# Patient Record
Sex: Male | Born: 1985 | Race: White | Hispanic: No | Marital: Married | Smoking: Never smoker
Health system: Southern US, Community
[De-identification: ages and names within clinical notes are randomized; demographics above are authoritative.]

## PROBLEM LIST (undated history)

## (undated) DIAGNOSIS — F429 Obsessive-compulsive disorder, unspecified: Secondary | ICD-10-CM

## (undated) DIAGNOSIS — I10 Essential (primary) hypertension: Secondary | ICD-10-CM

## (undated) DIAGNOSIS — K219 Gastro-esophageal reflux disease without esophagitis: Secondary | ICD-10-CM

---

## 2016-05-24 ENCOUNTER — Encounter (HOSPITAL_COMMUNITY): Payer: Self-pay | Admitting: Emergency Medicine

## 2016-05-24 ENCOUNTER — Ambulatory Visit (HOSPITAL_COMMUNITY)
Admission: EM | Admit: 2016-05-24 | Discharge: 2016-05-24 | Disposition: A | Discharge status: Home / Self Care | Payer: Managed Care, Other (non HMO) | Attending: Internal Medicine | Admitting: Internal Medicine

## 2016-05-24 DIAGNOSIS — K529 Noninfective gastroenteritis and colitis, unspecified: Secondary | ICD-10-CM | POA: Diagnosis not present

## 2016-05-24 DIAGNOSIS — K219 Gastro-esophageal reflux disease without esophagitis: Secondary | ICD-10-CM

## 2016-05-24 HISTORY — DX: Gastro-esophageal reflux disease without esophagitis: K21.9

## 2016-05-24 HISTORY — DX: Essential (primary) hypertension: I10

## 2016-05-24 HISTORY — DX: Obsessive-compulsive disorder, unspecified: F42.9

## 2016-05-24 MED ORDER — ONDANSETRON HCL 4 MG PO TABS: 8.0000 mg | ORAL_TABLET | ORAL | 0 refills | Status: AC | PRN

## 2016-05-24 MED ORDER — ONDANSETRON 4 MG PO TBDP
ORAL_TABLET | ORAL | Status: AC
  Filled 2016-05-24: qty 2

## 2016-05-24 MED ORDER — METOCLOPRAMIDE HCL 10 MG PO TABS: 10.0000 mg | ORAL_TABLET | Freq: Four times a day (QID) | ORAL | 0 refills | Status: DC | PRN

## 2016-05-24 MED ORDER — ONDANSETRON 4 MG PO TBDP
8.0000 mg | ORAL_TABLET | Freq: Once | ORAL | Status: AC
  Administered 2016-05-24: 8 mg via ORAL

## 2016-05-24 NOTE — ED Provider Notes (Signed)
[facility]    CSN: [id number] Arrival date & time: 05/24/16  1946     History   Chief Complaint Chief Complaint  Patient presents with  . Nausea    HPI David Hanson is a 31 y.o. male. He presents today with recent history of diarrhea, and vomiting started early yesterday am. Vomiting had subsided a little after midnight today, and he has been able to keep sips of liquids down. Ate a bagel about 4 PM, and has been extremely nauseous since. Worried that the vomiting will start again.  No abdominal pain, no fever.      HPI  Past Medical History:  Diagnosis Date  . GERD (gastroesophageal reflux disease)   . Hypertension   . OCD (obsessive compulsive disorder)     History reviewed. No pertinent surgical history.     Home Medications    Prior to Admission medications   Medication Sig Start Date End Date Taking? Authorizing Provider  lisinopril-hydrochlorothiazide (PRINZIDE,ZESTORETIC) 20-25 MG tablet Take 1 tablet by mouth daily.   Yes [provider]  omeprazole (PRILOSEC) 20 MG capsule Take 20 mg by mouth daily.   Yes [provider]  PARoxetine HCl (PAXIL PO) Take by mouth.   Yes [provider]  promethazine (PHENERGAN) 25 MG tablet Take 25 mg by mouth every 6 (six) hours as needed for nausea or vomiting.   Yes [provider]  ranitidine (ZANTAC) 150 MG capsule Take 150 mg by mouth 2 (two) times daily.   Yes [provider]  metoCLOPramide (REGLAN) 10 MG tablet Take 1 tablet (10 mg total) by mouth 4 (four) times daily as needed for nausea. 05/24/16   Eustace MooreMurray, Dorena Dorfman W, MD  ondansetron (ZOFRAN) 4 MG tablet Take 2 tablets (8 mg total) by mouth every 4 (four) hours as needed for nausea or vomiting. 05/24/16   Eustace MooreMurray, Alizzon Dioguardi W, MD    Family History No family history on file.  Social History Social History  Substance Use Topics  . Smoking status: Never Smoker  . Smokeless tobacco: Not on file  . Alcohol use Yes      Allergies   Augmentin [amoxicillin-pot clavulanate]; Ciprofloxacin; and Suprax [cefixime]   Review of Systems Review of Systems  All other systems reviewed and are negative.    Physical Exam Triage Vital Signs ED Triage Vitals [05/24/16 2025]  Enc Vitals Group     BP 127/79     Pulse Rate (!) 105     Resp (!) 24     Temp 98.5 F (36.9 C)     Temp Source Oral     SpO2 99 %     Weight      Height      Pain Score      Pain Loc    Updated Vital Signs BP 127/79 (BP Location: Right Arm)   Pulse (!) 105   Temp 98.5 F (36.9 C) (Oral)   Resp (!) 24   SpO2 99%   Physical Exam  Constitutional: He is oriented to person, place, and time. No distress.  Alert, nicely groomed  HENT:  Head: Atraumatic.  Eyes:  Conjugate gaze, no eye redness/drainage  Neck: Neck supple.  Cardiovascular: Regular rhythm.   HR 100s  Pulmonary/Chest: No respiratory distress. He has no wheezes. He has no rales.  Lungs clear, symmetric breath sounds.  Abdominal: He exhibits no distension.  Musculoskeletal: Normal range of motion.  Neurological: He is alert and oriented to person, place, and time.  Skin: Skin is warm and dry.  No cyanosis  Nursing note and vitals reviewed.    UC Treatments / Results   Procedures Procedures (including critical care time)  Medications Ordered in UC Medications  ondansetron (ZOFRAN-ODT) disintegrating tablet 8 mg (8 mg Oral Given 05/24/16 2120)     Final Clinical Impressions(s) / UC Diagnoses   Final diagnoses:  Gastroenteritis, acute  Gastric reflux   Anticipate gradual improvement in nausea and diarrhea over the next several days.  Diarrhea may take a couple weeks to resolve.  Prescription for  Recheck for new fever >100.5, return of vomiting, severe/persistent abdominal pain, or if not starting to improve in a few days.    New Prescriptions Discharge Medication List as of 05/24/2016  9:41 PM    START taking these medications   Details   metoCLOPramide (REGLAN) 10 MG tablet Take 1 tablet (10 mg total) by mouth 4 (four) times daily as needed for nausea., Starting Sun 05/24/2016, Normal    ondansetron (ZOFRAN) 4 MG tablet Take 2 tablets (8 mg total) by mouth every 4 (four) hours as needed for nausea or vomiting., Starting Sun 05/24/2016, Normal         Eustace Moore, MD 05/27/16 2138

## 2016-05-24 NOTE — ED Triage Notes (Signed)
Nausea woke patient late Friday night.  Vomiting started around 2 pm Saturday.    Reports diarrhea prior to Friday.

## 2016-05-24 NOTE — Discharge Instructions (Addendum)
Anticipate gradual improvement in nausea and diarrhea over the next several days.  Diarrhea may take a couple weeks to resolve.  Prescription for  Recheck for new fever >100.5, return of vomiting, severe/persistent abdominal pain, or if not starting to improve in a few days.

## 2017-02-16 ENCOUNTER — Other Ambulatory Visit (HOSPITAL_COMMUNITY): Payer: Self-pay | Admitting: Family Medicine

## 2017-02-16 DIAGNOSIS — K219 Gastro-esophageal reflux disease without esophagitis: Secondary | ICD-10-CM

## 2017-02-22 ENCOUNTER — Ambulatory Visit (HOSPITAL_COMMUNITY): Payer: Managed Care, Other (non HMO)

## 2020-01-09 ENCOUNTER — Other Ambulatory Visit: Payer: Self-pay

## 2020-01-09 ENCOUNTER — Emergency Department (INDEPENDENT_AMBULATORY_CARE_PROVIDER_SITE_OTHER): Payer: Managed Care, Other (non HMO)

## 2020-01-09 ENCOUNTER — Emergency Department (INDEPENDENT_AMBULATORY_CARE_PROVIDER_SITE_OTHER)
Admission: EM | Admit: 2020-01-09 | Discharge: 2020-01-09 | Disposition: A | Discharge status: Home / Self Care | Payer: Managed Care, Other (non HMO) | Source: Home / Self Care

## 2020-01-09 DIAGNOSIS — K5900 Constipation, unspecified: Secondary | ICD-10-CM | POA: Diagnosis not present

## 2020-01-09 DIAGNOSIS — R109 Unspecified abdominal pain: Secondary | ICD-10-CM

## 2020-01-09 MED ORDER — POLYETHYLENE GLYCOL 3350 17 G PO PACK: 17.0000 g | PACK | Freq: Two times a day (BID) | ORAL | 0 refills | Status: AC

## 2020-01-09 NOTE — Discharge Instructions (Addendum)
As we discussed, there is a significant amount of undigested food needs to pass to relieve the bowel pressure.  We called in MiraLAX which can be taken twice a day as directed over the next several days to clear out the bowel and recent the tone of the bowel wall.

## 2020-01-09 NOTE — ED Triage Notes (Signed)
Pt c/o middle to lower abd pain since yesterday morning. More BMs than usual, BMs lighter in color. Pain 3/10 Feels like a pulling pressure. Starts in bellybutton and radiates down. Motrin prn.

## 2020-01-09 NOTE — ED Provider Notes (Signed)
[facility]    CSN: [id number] Arrival date & time: 01/09/20  1621      History   Chief Complaint Chief Complaint  Patient presents with  . Abdominal Pain    HPI David Hanson is a 34 y.o. male.   This is the initial [facility] urgent care patient visit for this 34 year old man complaining of lower abdominal pain.  Pt c/o middle to lower abd pain since yesterday morning. More BMs than usual, BMs lighter in color. Pain 3/10 Feels like a pulling pressure. Starts in bellybutton and radiates down. Motrin prn.   Patient has reflux esophagitis history but this has not worsened recently.  Patient stools have been more liquidy than normal.  Is been going more frequently as well.  Most of the pain disappears when he is quiet either sitting or lying, but the symptoms worsen when he is moving around.  Patient works in [employer].     Past Medical History:  Diagnosis Date  . GERD (gastroesophageal reflux disease)   . Hypertension   . OCD (obsessive compulsive disorder)     There are no problems to display for this patient.   History reviewed. No pertinent surgical history.     Home Medications    Prior to Admission medications   Medication Sig Start Date End Date Taking? Authorizing Provider  polyethylene glycol (MIRALAX MIX-IN PAX) 17 g packet Take 17 g by mouth 2 (two) times daily. 01/09/20  Yes Elvina Sidle, MD  lisinopril-hydrochlorothiazide (PRINZIDE,ZESTORETIC) 20-25 MG tablet Take 1 tablet by mouth daily.    [provider]  omeprazole (PRILOSEC) 20 MG capsule Take 20 mg by mouth daily.    [provider]  ondansetron (ZOFRAN) 4 MG tablet Take 2 tablets (8 mg total) by mouth every 4 (four) hours as needed for nausea or vomiting. 05/24/16   Isa Rankin, MD  PARoxetine HCl (PAXIL PO) Take by mouth.    [provider]  promethazine (PHENERGAN) 25 MG tablet Take 25 mg by mouth every 6 (six) hours as needed  for nausea or vomiting.    [provider]  ranitidine (ZANTAC) 150 MG capsule Take 150 mg by mouth 2 (two) times daily.    [provider]  metoCLOPramide (REGLAN) 10 MG tablet Take 1 tablet (10 mg total) by mouth 4 (four) times daily as needed for nausea. 05/24/16 01/09/20  Isa Rankin, MD    Family History History reviewed. No pertinent family history.  Social History Social History   Tobacco Use  . Smoking status: Never Smoker  Substance Use Topics  . Alcohol use: Yes  . Drug use: No     Allergies   Augmentin [amoxicillin-pot clavulanate], Ciprofloxacin, and Suprax [cefixime]   Review of Systems Review of Systems  Constitutional: Negative.   Gastrointestinal: Positive for abdominal pain. Negative for diarrhea, nausea and vomiting.     Physical Exam Triage Vital Signs ED Triage Vitals  Enc Vitals Group     BP      Pulse      Resp      Temp      Temp src      SpO2      Weight      Height      Head Circumference      Peak Flow      Pain Score      Pain Loc      Pain Edu?      Excl. in  GC?    No data found.  Updated Vital Signs BP (!) 130/93 (BP Location: Right Arm)   Pulse (!) 104   Temp 98.1 F (36.7 C) (Oral)   Resp 18   SpO2 98%    Physical Exam Vitals and nursing note reviewed.  Constitutional:      General: He is not in acute distress.    Appearance: He is well-developed. He is obese. He is not ill-appearing.  HENT:     Head: Normocephalic.     Mouth/Throat:     Mouth: Mucous membranes are moist.  Eyes:     Extraocular Movements: Extraocular movements intact.     Comments: No scleral icterus  Cardiovascular:     Rate and Rhythm: Normal rate.     Heart sounds: Normal heart sounds.  Pulmonary:     Effort: Pulmonary effort is normal.     Breath sounds: Normal breath sounds.  Abdominal:     General: Abdomen is protuberant. Bowel sounds are normal.     Palpations: Abdomen is soft. There is no hepatomegaly or  mass.     Tenderness: There is abdominal tenderness in the periumbilical area. There is no guarding or rebound.  Skin:    General: Skin is warm and dry.  Neurological:     General: No focal deficit present.     Mental Status: He is alert and oriented to person, place, and time.  Psychiatric:        Mood and Affect: Mood normal.        Behavior: Behavior normal.      UC Treatments / Results  Labs (all labs ordered are listed, but only abnormal results are displayed) Labs Reviewed - No data to display  EKG   Radiology No results found.  Procedures Procedures (including critical care time)  Medications Ordered in UC Medications - No data to display  Initial Impression / Assessment and Plan / UC Course  I have reviewed the triage vital signs and the nursing notes.  Pertinent labs & imaging results that were available during my care of the patient were reviewed by me and considered in my medical decision making (see chart for details).    Final Clinical Impressions(s) / UC Diagnoses   Final diagnoses:  Obstipation     Discharge Instructions     As we discussed, there is a significant amount of undigested food needs to pass to relieve the bowel pressure.  We called in MiraLAX which can be taken twice a day as directed over the next several days to clear out the bowel and recent the tone of the bowel wall.    ED Prescriptions    Medication Sig Dispense Auth. Provider   polyethylene glycol (MIRALAX MIX-IN PAX) 17 g packet Take 17 g by mouth 2 (two) times daily. 14 each Elvina Sidle, MD     I have reviewed the PDMP during this encounter.   Elvina Sidle, MD 01/09/20 1733

## 2021-09-25 IMAGING — DX DG ABDOMEN ACUTE W/ 1V CHEST
4 series · 4 of 4 positions shown · non-contrast
Comparison: None.

CLINICAL DATA: Mid abdominal pain over the last 2 days.

EXAM:
DG ABDOMEN ACUTE WITH 1 VIEW CHEST

[chest pa]
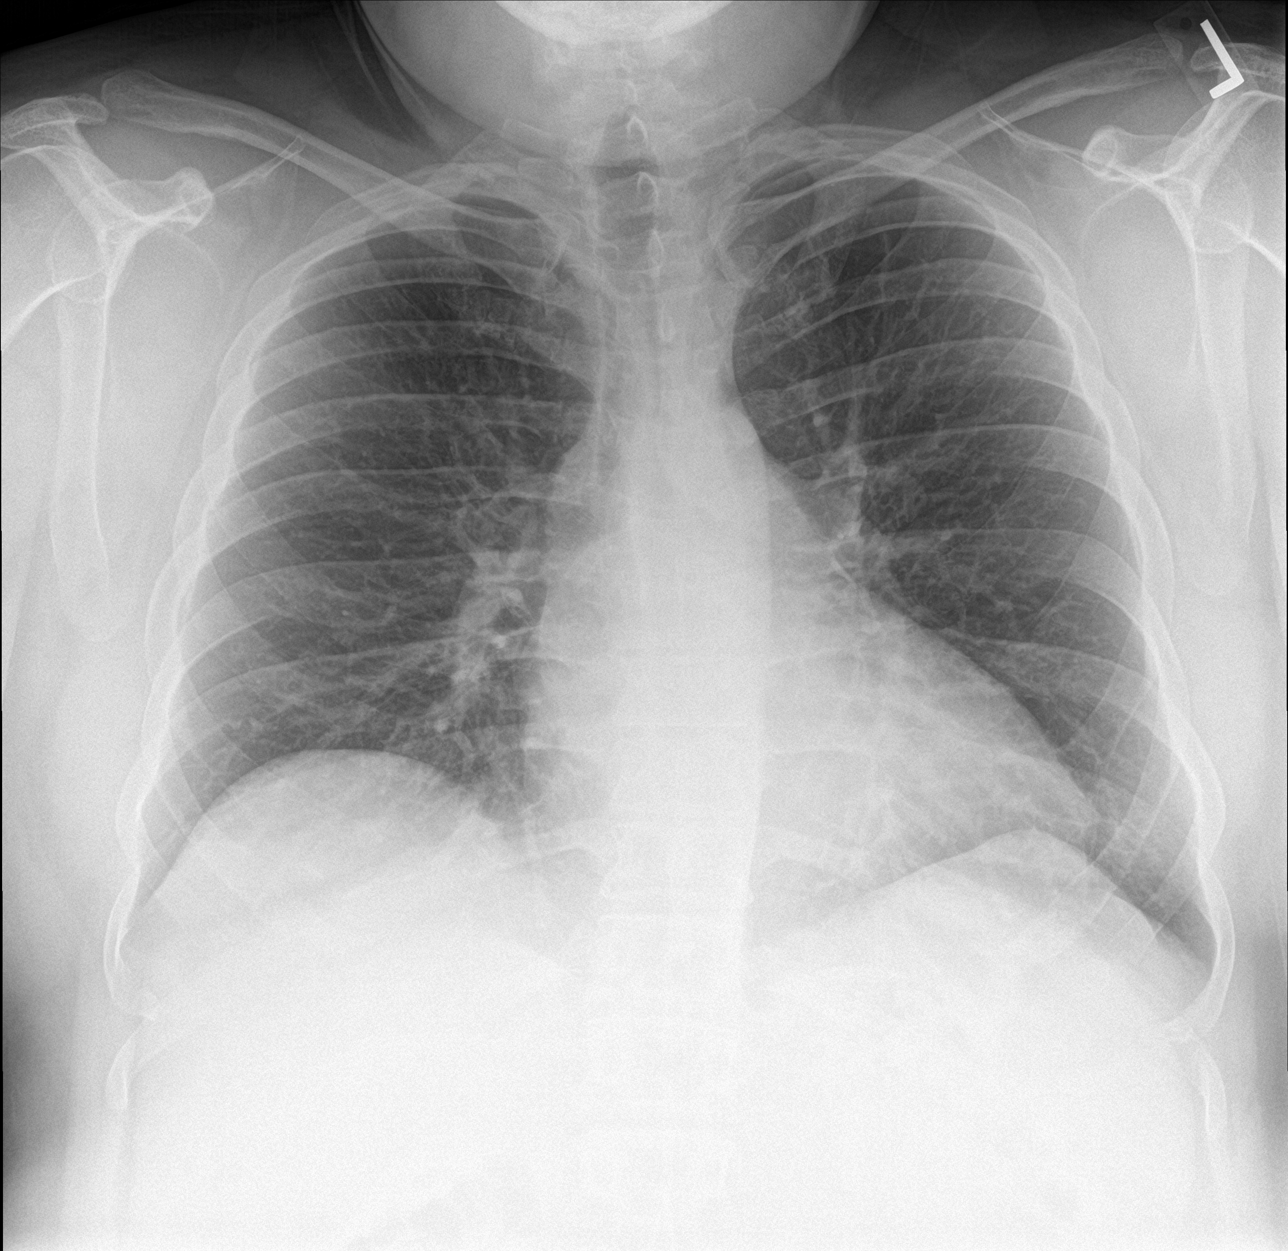

[abdomen erect]
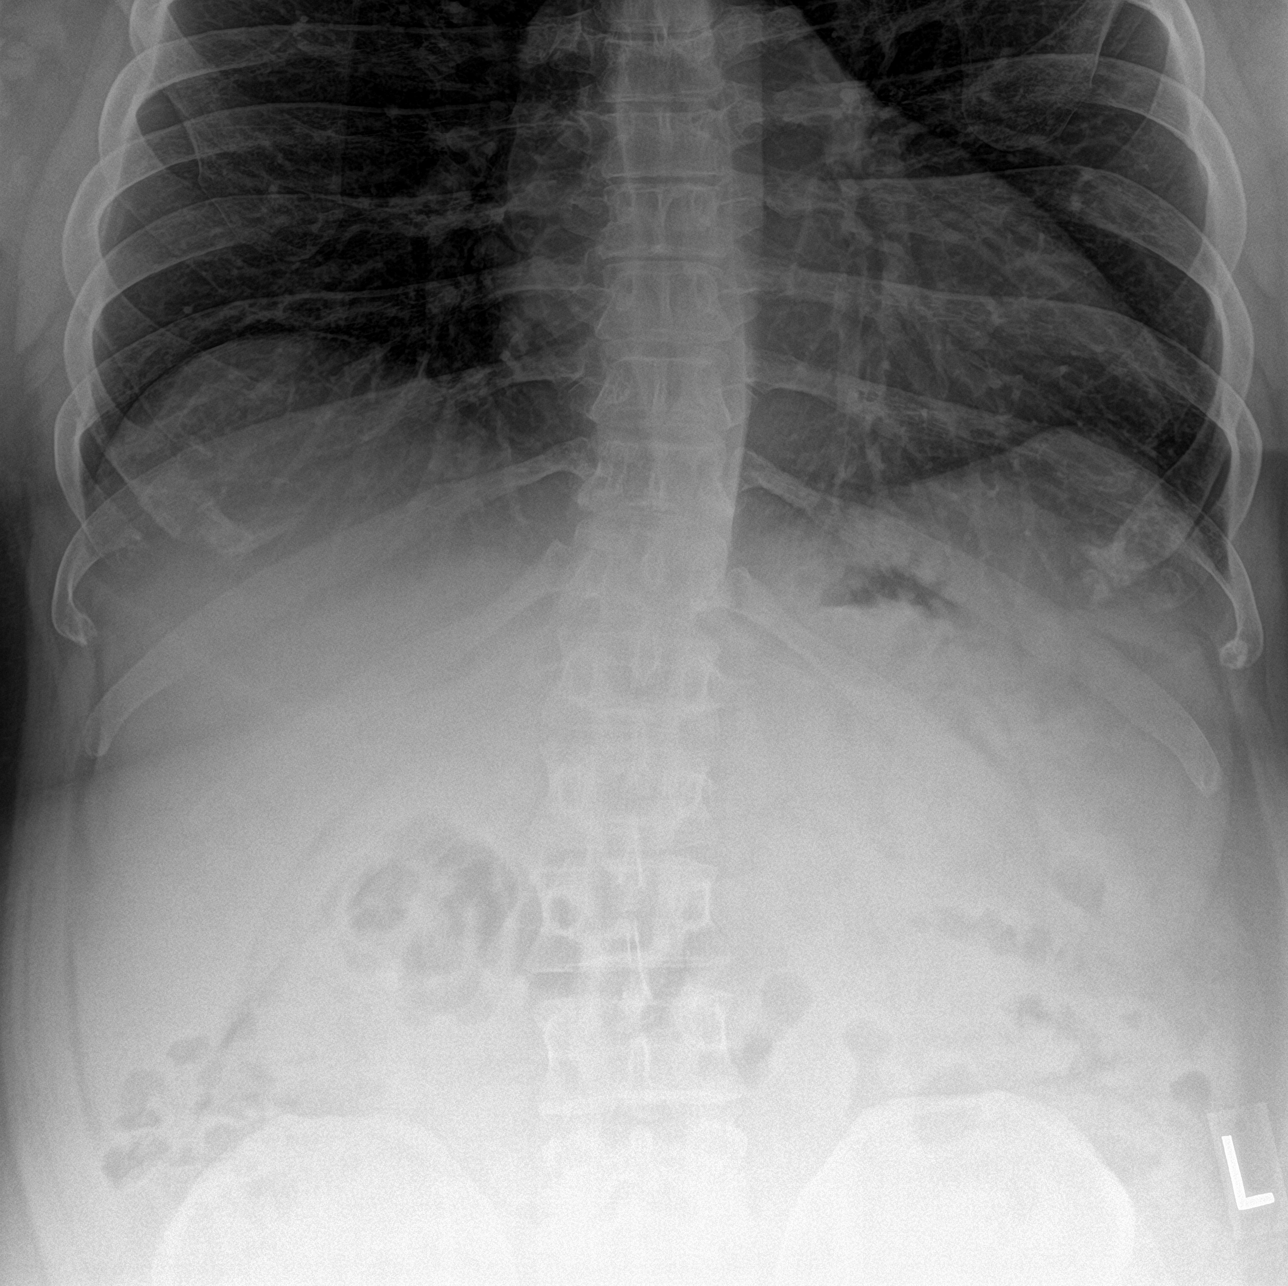

[abdomen supine (1 of 2)]
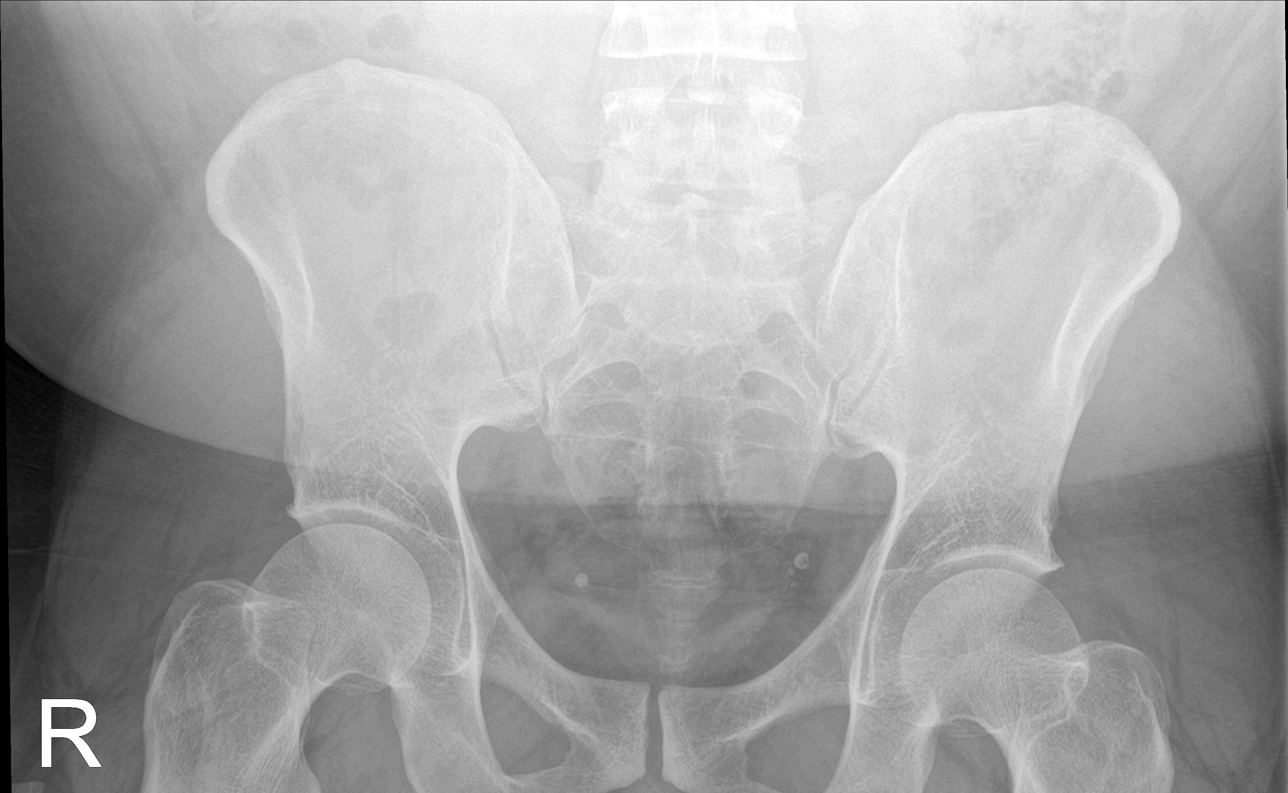

[abdomen supine (2 of 2)]
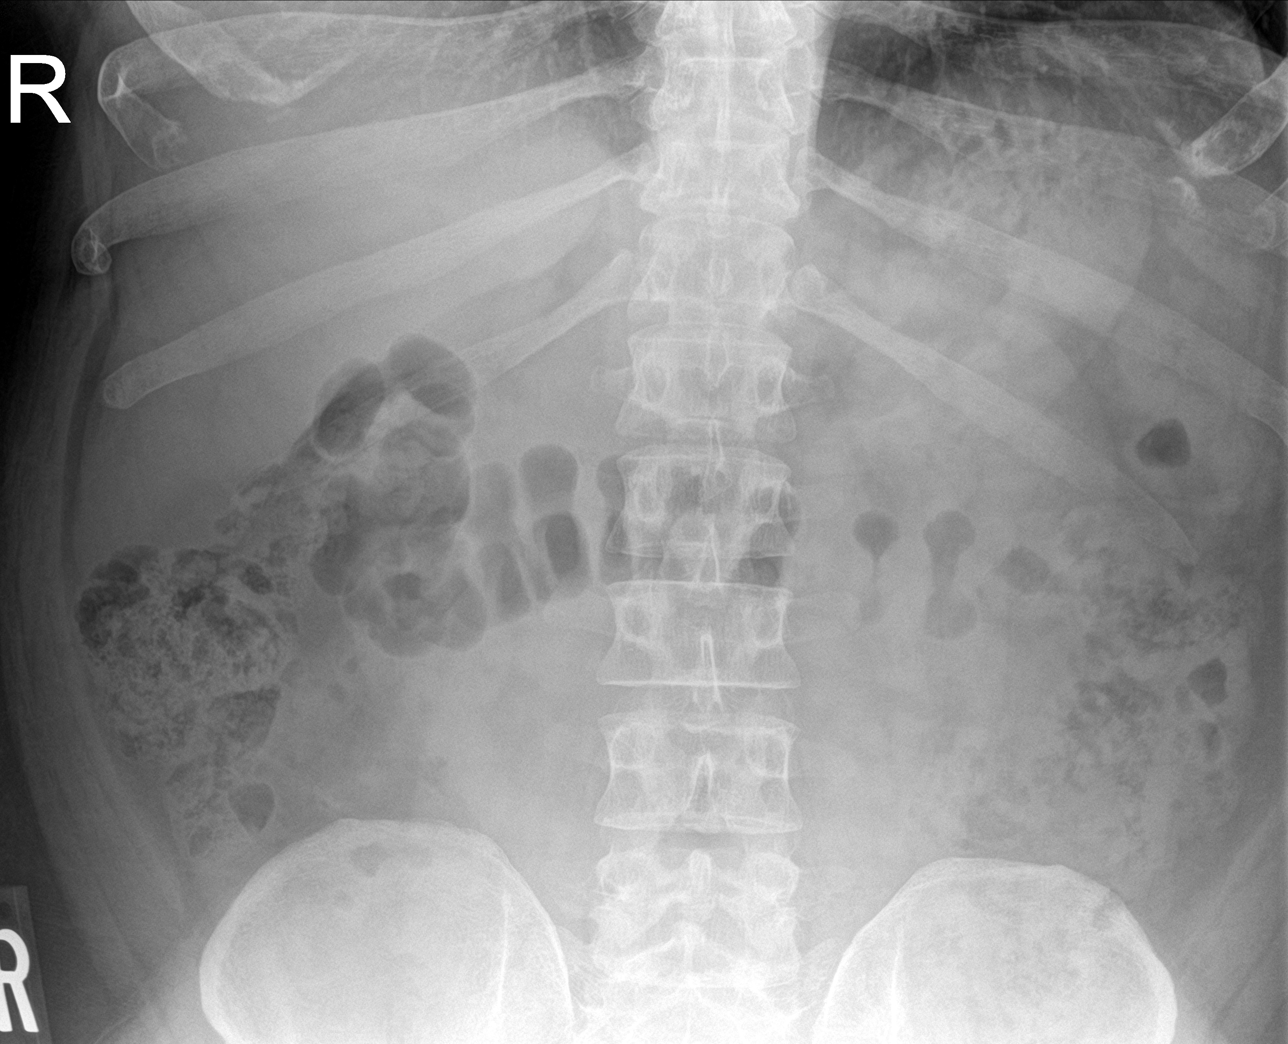

[4 of 4 positions shown; findings below may reference images not displayed]

FINDINGS: No sign of obstruction, ileus or free air. Moderate amount of fecal
matter in the colon, but within the range of normal. No significant
soft tissue calcifications. Minimal spinal curvature.

One-view chest shows normal heart and mediastinal shadows. The lungs
are clear.
IMPRESSION: No acute finding. Moderate amount of fecal matter in the colon, but
within the range of normal.
